# Patient Record
Sex: Female | Born: 2000 | Race: White | Hispanic: Yes | Marital: Single | Smoking: Never smoker
Health system: Southern US, Community
[De-identification: ages and names within clinical notes are randomized; demographics above are authoritative.]

## PROBLEM LIST (undated history)

## (undated) HISTORY — PX: TONSILLECTOMY: SUR1361

---
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
## 2022-05-14 ENCOUNTER — Other Ambulatory Visit: Payer: Self-pay

## 2022-05-14 ENCOUNTER — Encounter (HOSPITAL_BASED_OUTPATIENT_CLINIC_OR_DEPARTMENT_OTHER): Payer: Self-pay | Admitting: Emergency Medicine

## 2022-05-14 ENCOUNTER — Emergency Department (HOSPITAL_BASED_OUTPATIENT_CLINIC_OR_DEPARTMENT_OTHER)
Admission: EM | Admit: 2022-05-14 | Discharge: 2022-05-15 | Disposition: A | Discharge status: Home / Self Care | Payer: BLUE CROSS/BLUE SHIELD | Attending: Emergency Medicine | Admitting: Emergency Medicine

## 2022-05-14 DIAGNOSIS — X102XXA Contact with fats and cooking oils, initial encounter: Secondary | ICD-10-CM | POA: Insufficient documentation

## 2022-05-14 DIAGNOSIS — T25122A Burn of first degree of left foot, initial encounter: Secondary | ICD-10-CM | POA: Insufficient documentation

## 2022-05-14 DIAGNOSIS — Z23 Encounter for immunization: Secondary | ICD-10-CM | POA: Insufficient documentation

## 2022-05-14 DIAGNOSIS — T23172A Burn of first degree of left wrist, initial encounter: Secondary | ICD-10-CM | POA: Insufficient documentation

## 2022-05-14 DIAGNOSIS — T2122XA Burn of second degree of abdominal wall, initial encounter: Secondary | ICD-10-CM | POA: Insufficient documentation
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
## 2022-05-15 MED ORDER — SILVER SULFADIAZINE 1 % EX CREA
TOPICAL_CREAM | Freq: Once | CUTANEOUS | Status: AC
  Filled 2022-05-15: qty 85

## 2022-05-15 MED ORDER — TETANUS-DIPHTH-ACELL PERTUSSIS 5-2.5-18.5 LF-MCG/0.5 IM SUSY
0.5000 mL | PREFILLED_SYRINGE | Freq: Once | INTRAMUSCULAR | Status: AC
  Administered 2022-05-15: 0.5 mL via INTRAMUSCULAR
  Filled 2022-05-15: qty 0.5
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
## 2022-05-15 NOTE — Discharge Instructions (Signed)
Please use the Silvadene cream daily along with daily dressing changes for the next 7 to 10 days.  I would like for you to establish care with a primary care physician.  I also listed contact information for wound center who can follow-up regarding your abdominal wound.  Return with any new or suddenly worsening symptoms.

## 2022-10-17 ENCOUNTER — Encounter (HOSPITAL_BASED_OUTPATIENT_CLINIC_OR_DEPARTMENT_OTHER): Payer: Self-pay | Admitting: Emergency Medicine

## 2022-10-17 ENCOUNTER — Other Ambulatory Visit: Payer: Self-pay

## 2022-10-17 ENCOUNTER — Emergency Department (HOSPITAL_BASED_OUTPATIENT_CLINIC_OR_DEPARTMENT_OTHER)
Admission: EM | Admit: 2022-10-17 | Discharge: 2022-10-17 | Disposition: A | Discharge status: Home / Self Care | Payer: Self-pay | Attending: Emergency Medicine | Admitting: Emergency Medicine

## 2022-10-17 DIAGNOSIS — R7401 Elevation of levels of liver transaminase levels: Secondary | ICD-10-CM | POA: Insufficient documentation

## 2022-10-17 DIAGNOSIS — R21 Rash and other nonspecific skin eruption: Secondary | ICD-10-CM | POA: Insufficient documentation

## 2022-10-17 DIAGNOSIS — Z91040 Latex allergy status: Secondary | ICD-10-CM | POA: Insufficient documentation

## 2022-10-17 DIAGNOSIS — D72829 Elevated white blood cell count, unspecified: Secondary | ICD-10-CM | POA: Insufficient documentation

## 2022-10-17 LAB — CBC WITH DIFFERENTIAL/PLATELET
Abs Immature Granulocytes: 0.04 10*3/uL (ref 0.00–0.07)
Basophils Absolute: 0 10*3/uL (ref 0.0–0.1)
Basophils Relative: 0 %
Eosinophils Absolute: 0.1 10*3/uL (ref 0.0–0.5)
Eosinophils Relative: 1 %
HCT: 43 % (ref 36.0–46.0)
Hemoglobin: 15.1 g/dL — ABNORMAL HIGH (ref 12.0–15.0)
Immature Granulocytes: 0 %
Lymphocytes Relative: 10 %
Lymphs Abs: 1 10*3/uL (ref 0.7–4.0)
MCH: 36.6 pg — ABNORMAL HIGH (ref 26.0–34.0)
MCHC: 35.1 g/dL (ref 30.0–36.0)
MCV: 104.1 fL — ABNORMAL HIGH (ref 80.0–100.0)
Monocytes Absolute: 0.8 10*3/uL (ref 0.1–1.0)
Monocytes Relative: 7 %
Neutro Abs: 8.7 10*3/uL — ABNORMAL HIGH (ref 1.7–7.7)
Neutrophils Relative %: 82 %
Platelets: 179 10*3/uL (ref 150–400)
RBC: 4.13 MIL/uL (ref 3.87–5.11)
RDW: 12.6 % (ref 11.5–15.5)
WBC: 10.7 10*3/uL — ABNORMAL HIGH (ref 4.0–10.5)
nRBC: 0 % (ref 0.0–0.2)

## 2022-10-17 LAB — COMPREHENSIVE METABOLIC PANEL
ALT: 132 U/L — ABNORMAL HIGH (ref 0–44)
AST: 260 U/L — ABNORMAL HIGH (ref 15–41)
Albumin: 4.5 g/dL (ref 3.5–5.0)
Alkaline Phosphatase: 166 U/L — ABNORMAL HIGH (ref 38–126)
Anion gap: 16 — ABNORMAL HIGH (ref 5–15)
BUN: 5 mg/dL — ABNORMAL LOW (ref 6–20)
CO2: 22 mmol/L (ref 22–32)
Calcium: 9.5 mg/dL (ref 8.9–10.3)
Chloride: 98 mmol/L (ref 98–111)
Creatinine, Ser: 0.57 mg/dL (ref 0.44–1.00)
GFR, Estimated: >60 mL/min (ref >60)
Glucose, Bld: 105 mg/dL — ABNORMAL HIGH (ref 70–99)
Potassium: 3.4 mmol/L — ABNORMAL LOW (ref 3.5–5.1)
Sodium: 136 mmol/L (ref 135–145)
Total Bilirubin: 2.2 mg/dL — ABNORMAL HIGH (ref 0.3–1.2)
Total Protein: 9.1 g/dL — ABNORMAL HIGH (ref 6.5–8.1)

## 2022-10-17 LAB — HIV ANTIBODY (ROUTINE TESTING W REFLEX): HIV Screen 4th Generation wRfx: NONREACTIVE

## 2022-10-17 LAB — CBC
HCT: 44.6 % (ref 36.0–46.0)
Hemoglobin: 15.6 g/dL — ABNORMAL HIGH (ref 12.0–15.0)
MCH: 36.8 pg — ABNORMAL HIGH (ref 26.0–34.0)
MCHC: 35 g/dL (ref 30.0–36.0)
MCV: 105.2 fL — ABNORMAL HIGH (ref 80.0–100.0)
Platelets: 181 10*3/uL (ref 150–400)
RBC: 4.24 MIL/uL (ref 3.87–5.11)
RDW: 12.8 % (ref 11.5–15.5)
WBC: 11.6 10*3/uL — ABNORMAL HIGH (ref 4.0–10.5)
nRBC: 0 % (ref 0.0–0.2)

## 2022-10-17 LAB — DIFFERENTIAL
Abs Immature Granulocytes: 0.03 10*3/uL (ref 0.00–0.07)
Basophils Absolute: 0.1 10*3/uL (ref 0.0–0.1)
Basophils Relative: 1 %
Eosinophils Absolute: 0.2 10*3/uL (ref 0.0–0.5)
Eosinophils Relative: 1 %
Immature Granulocytes: 0 %
Lymphocytes Relative: 9 %
Lymphs Abs: 1 10*3/uL (ref 0.7–4.0)
Monocytes Absolute: 0.7 10*3/uL (ref 0.1–1.0)
Monocytes Relative: 6 %
Neutro Abs: 9.3 10*3/uL — ABNORMAL HIGH (ref 1.7–7.7)
Neutrophils Relative %: 83 %

## 2022-10-17 MED ORDER — DIPHENHYDRAMINE HCL 25 MG PO CAPS
25.0000 mg | ORAL_CAPSULE | Freq: Once | ORAL | Status: AC
  Administered 2022-10-17: 25 mg via ORAL
  Filled 2022-10-17: qty 1

## 2022-10-17 MED ORDER — DOXYCYCLINE HYCLATE 100 MG PO CAPS: 100.0000 mg | ORAL_CAPSULE | Freq: Two times a day (BID) | ORAL | 0 refills | Status: AC

## 2022-10-17 MED ORDER — PREDNISONE 10 MG PO TABS
ORAL_TABLET | ORAL | 0 refills | Status: AC
Start: 2022-10-17 — End: 2022-10-24

## 2022-10-17 NOTE — Discharge Instructions (Addendum)
We are unsure what is the cause of your rash.  You are being treated with an antibiotic in case this is due to a tick bite causing [facility] spotted fever.  You have also been started on a steroid called prednisone to help with itching.  Please take this in the taper format as prescribed.  Take it first thing in the morning as it can keep you up at night.  You have elevation in your liver enzymes which is concerning.  Your syphilis testing, HIV testing, and [place] spotted fever antibody testing is still in process.  Please contact the  community health and wellness center listed below to get a follow-up appointment within the next 3 days to recheck your liver enzymes and to follow-up on these labs.  If your rash does not start to improve within the next 4 to 5 days, you begin to have frequent nosebleeds, mouth bleeds, start bruising a lot, you have any worsening of your rash, any fever, please go directly to [facility] ER or [facility] ER as they have dermatology present.  We unfortunately do not have dermatology here at [facility].

## 2022-10-17 NOTE — ED Provider Notes (Signed)
[facility] Provider Note   CSN: [id number] Arrival date & time: 10/17/22  1143     History  Chief Complaint  Patient presents with   Rash    Jeslin Bazinet is a 22 y.o. female with history of eczema who presents with concern for an itchy rash that has been ongoing for 2 weeks.  Reports that the rash started on the left upper thigh/groin area, and spread to involve her whole body.  The rash is present on the nose, outer ear, scalp diffusely, neck and back, chest and stomach, arms and legs bilaterally.  Also present on the palms of her hands.  No involvement of the soles of her feet or mucosal surfaces.  Denies any recent antibiotic use, new medications, tick exposure, fever or chills.  She is sexually active and does have unprotected sexual intercourse.   Rash      Home Medications Prior to Admission medications   Medication Sig Start Date End Date Taking? Authorizing Provider  doxycycline (VIBRAMYCIN) 100 MG capsule Take 1 capsule (100 mg total) by mouth 2 (two) times daily for 7 days. 10/17/22 10/24/22 Yes Arabella Merles, PA-C  predniSONE (DELTASONE) 10 MG tablet Take 4 tablets (40 mg total) by mouth daily with breakfast for 2 days, THEN 3 tablets (30 mg total) daily with breakfast for 2 days, THEN 2 tablets (20 mg total) daily with breakfast for 2 days, THEN 1 tablet (10 mg total) daily with breakfast for 1 day. 10/17/22 10/24/22 Yes Arabella Merles, PA-C  acetaminophen (TYLENOL) 325 MG tablet Take 650 mg by mouth every 6 (six) hours as needed. Reported on 12/19/2014    [provider]  nitrofurantoin, macrocrystal-monohydrate, (MACROBID) 100 MG capsule Take 1 capsule (100 mg total) by mouth 2 (two) times daily. 12/19/14   Emi Belfast, FNP  norgestimate-ethinyl estradiol (ORTHO-CYCLEN,SPRINTEC,PREVIFEM) 0.25-35 MG-MCG tablet Take 1 tablet by mouth daily. 12/19/14   Emi Belfast, FNP      Allergies     Latex    Review of Systems   Review of Systems  Skin:  Positive for rash.    Physical Exam Updated Vital Signs BP 108/78   Pulse 81   Temp 98.3 F (36.8 C)   Resp 16   SpO2 100%  Physical Exam Vitals and nursing note reviewed.  Constitutional:      Appearance: Normal appearance.  HENT:     Head: Atraumatic.     Comments: Scalp with diffuse plaque with overlying scale extending from the occipital base to the forehead.    Right Ear: Tympanic membrane normal.     Left Ear: Tympanic membrane normal.  Cardiovascular:     Rate and Rhythm: Normal rate and regular rhythm.  Pulmonary:     Effort: Pulmonary effort is normal.  Skin:    Comments: Diffuse erythematous macules over the posterior neck, back, chest wall, abdomen, arms bilaterally, palms bilaterally, lower extremities from the thighs to the dorsum of the feet bilaterally.  Rash does not involve the soles of the feet, oral mucosa.  Rash present on the external ear, no rash of the external auditory canal.   Neurological:     General: No focal deficit present.     Mental Status: She is alert.  Psychiatric:        Mood and Affect: Mood normal.        Behavior: Behavior normal.  ED Results / Procedures / Treatments   Labs (all labs ordered are listed, but only abnormal results are displayed) Labs Reviewed  CBC - Abnormal; Notable for the following components:      Result Value   WBC 11.6 (*)    Hemoglobin 15.6 (*)    MCV 105.2 (*)    MCH 36.8 (*)    All other components within normal limits  COMPREHENSIVE METABOLIC PANEL - Abnormal; Notable for the following components:   Potassium 3.4 (*)    Glucose, Bld 105 (*)    BUN 5 (*)    Total Protein 9.1 (*)    AST 260 (*)    ALT 132 (*)    Alkaline Phosphatase 166 (*)    Total Bilirubin 2.2 (*)    Anion gap 16 (*)    All other components within normal limits  CBC WITH DIFFERENTIAL/PLATELET - Abnormal; Notable for the following components:    WBC 10.7 (*)    Hemoglobin 15.1 (*)    MCV 104.1 (*)    MCH 36.6 (*)    Neutro Abs 8.7 (*)    All other components within normal limits  DIFFERENTIAL - Abnormal; Notable for the following components:   Neutro Abs 9.3 (*)    All other components within normal limits  RPR  HIV ANTIBODY (ROUTINE TESTING W REFLEX)  SPOTTED FEVER GROUP ANTIBODIES    EKG None  Radiology No results found.  Procedures Procedures    Medications Ordered in ED Medications  diphenhydrAMINE (BENADRYL) capsule 25 mg (25 mg Oral Given 10/17/22 1500)    ED Course/ Medical Decision Making/ A&P                                 Medical Decision Making Amount and/or Complexity of Data Reviewed Labs: ordered.  Risk Prescription drug management.   22 y.o. female with pertinent past medical history of eczema presents to the ED for concern of diffuse itchy rash over her body for the last 2 weeks  Differential diagnosis includes but is not limited to syphilis, [facility] spotted fever, psoriasis, nummular eczema, chicken pox  ED Course:  Patient overall well-appearing with stable vital signs.  Has had this full-body rash for the past 2 weeks which is very itchy.  On exam, she has an erythematous macular rash involving the neck, back, chest, arms and legs bilaterally.  Rashes present on the palms, but not the soles of the feet.  Rash present on the lips, but no involvement of the buccal mucosa.  Patient given Benadryl for itching.  I discussed this case with Dr. Lockie Mola who recommended getting basic labs, also discussed this with Dr. Adela Lank who recommended discussing this with ID. Upon reevaluation, patient states the Benadryl has not helped her itching, just made her drowsy.  I notified her that we are awaiting lab results and ID consult. Upon talking to ID, they recommended checking CBC with differential, [facility] spotted fever labs.  They also recommended RPR testing which has already been  obtained.  These new labs have been added.  They are a send out and will take a while to come back.  Will go ahead and treat patient with 7-day course of doxycycline in case this is recommended spotted fever and 7-day prednisone taper to help with itching.  I had an in-depth conversation with the patient explaining that this is a concerning rash that we do not  know the exact etiology of.  I explained that the leukocytosis and elevated liver enzymes are concerning.  She needs to reach out to the PCP office or dermatologist listed on her discharge paperwork for an appointment within the next 3 days for repeat labs, reevaluation, and to review the [facility] spotted fever antibody, RPR, HIV labs that are still pending. Patient given Benadryl for itching, upon reevaluation, states this has not helped with her itching.  Impression: Diffuse body rash  Disposition:  The patient was discharged home with instructions to take doxycycline and prednisone as prescribed.  Establish care with PCP or dermatologist within the next 3 days for repeat evaluation and repeat lab work.  I discussed strict return precautions, but advised that if the rash worsens or she begins to develop symptoms such as nosebleeds, easy bruising, fever, worsening rash, peeling of her skin, she needs to go directly to [facility] ER or [facility]  ER as they have dermatology present. Return precautions given.  Lab Tests: I Ordered, and personally interpreted labs.  The pertinent results include:   CBC with leukocytosis of 11.6, no elevation in eosinophils CMP with elevated AST at 260, ALT at 132, alk phos at 166, T. bili at 2.2 [facility] spotted fever antibodies, HIV, RPR pending  Consultations Obtained: I requested consultation with the infectious disease Dr. Thedore Mins,  and discussed lab and imaging findings as well as pertinent plan - they recommend: Obtaining [facility] spotted fever markup, eosinophils, syphilis  testing.    Social Determinants of Health:  impaired access to primary care             Final Clinical Impression(s) / ED Diagnoses Final diagnoses:  Rash    Rx / DC Orders ED Discharge Orders          Ordered    Ambulatory referral to Dermatology        10/17/22 1410    doxycycline (VIBRAMYCIN) 100 MG capsule  2 times daily        10/17/22 1717    predniSONE (DELTASONE) 10 MG tablet  Q breakfast        10/17/22 1717              Arabella Merles, PA-C 10/17/22 1756    Virgina Norfolk, DO 10/18/22 0801

## 2022-10-17 NOTE — ED Triage Notes (Signed)
Reports itching rash all over x 2 weeks , reports from had to toe , it burns as well.  Hx eczema.

## 2022-10-18 LAB — RPR: RPR Ser Ql: NONREACTIVE

## 2022-10-18 LAB — SPOTTED FEVER GROUP ANTIBODIES
Spotted Fever Group IgG: <1:64 {titer}
Spotted Fever Group IgM: <1:64 {titer}
